# Patient Record
Sex: Male | Born: 1975 | Race: White | Hispanic: No | Marital: Single | Smoking: Light tobacco smoker
Health system: Southern US, Community
[De-identification: ages and names within clinical notes are randomized; demographics above are authoritative.]

## PROBLEM LIST (undated history)

## (undated) ENCOUNTER — Emergency Department (HOSPITAL_COMMUNITY): Admission: EM | Payer: BLUE CROSS/BLUE SHIELD | Source: Home / Self Care

---

## 2010-03-09 ENCOUNTER — Emergency Department (HOSPITAL_COMMUNITY): Admission: EM | Admit: 2010-03-09 | Discharge: 2010-03-09 | Payer: Self-pay | Admitting: Emergency Medicine

## 2012-02-03 ENCOUNTER — Emergency Department (HOSPITAL_COMMUNITY)
Admission: EM | Admit: 2012-02-03 | Discharge: 2012-02-03 | Discharge status: Absent without leave | Payer: Self-pay | Source: Home / Self Care

## 2012-05-20 ENCOUNTER — Emergency Department (HOSPITAL_COMMUNITY)
Admission: EM | Admit: 2012-05-20 | Discharge: 2012-05-20 | Disposition: A | Discharge status: Home / Self Care | Payer: Self-pay

## 2013-01-18 ENCOUNTER — Emergency Department (HOSPITAL_COMMUNITY)
Admission: EM | Admit: 2013-01-18 | Discharge: 2013-01-18 | Discharge status: Against Medical Advice | Payer: Self-pay | Attending: Emergency Medicine | Admitting: Emergency Medicine

## 2013-01-18 DIAGNOSIS — R112 Nausea with vomiting, unspecified: Secondary | ICD-10-CM | POA: Insufficient documentation

## 2013-01-18 NOTE — ED Notes (Signed)
Pt BIB EMS. Pt c/o nausea and vomiting since 0900 this AM. Pt ambulatory from ambulance to triage with steady gait. Pt with no acute distress.

## 2014-08-22 ENCOUNTER — Emergency Department (HOSPITAL_COMMUNITY)
Admission: EM | Admit: 2014-08-22 | Discharge: 2014-08-22 | Disposition: A | Discharge status: Home / Self Care | Payer: Self-pay | Source: Home / Self Care | Attending: Emergency Medicine | Admitting: Emergency Medicine

## 2014-08-22 NOTE — ED Notes (Signed)
Patient not in waiting room  

## 2014-08-22 NOTE — ED Notes (Signed)
Patient did not answer.  

## 2014-08-22 NOTE — ED Notes (Signed)
Called in both waiting room without answer

## 2014-11-02 ENCOUNTER — Telehealth: Payer: Self-pay | Admitting: Family Medicine

## 2014-11-02 NOTE — Telephone Encounter (Signed)
Patient has bcbs. He is having trouble with urinary issues. Appointment given for tomorrow with Elmarie Shileyiffany

## 2014-11-03 ENCOUNTER — Ambulatory Visit (INDEPENDENT_AMBULATORY_CARE_PROVIDER_SITE_OTHER): Payer: BLUE CROSS/BLUE SHIELD | Admitting: Physician Assistant

## 2014-11-03 ENCOUNTER — Encounter: Payer: Self-pay | Admitting: Physician Assistant

## 2014-11-03 ENCOUNTER — Encounter (INDEPENDENT_AMBULATORY_CARE_PROVIDER_SITE_OTHER): Payer: Self-pay

## 2014-11-03 VITALS — BP 141/89 | HR 59 | Temp 98.1°F | Ht 70.0 in | Wt 205.0 lb

## 2014-11-03 DIAGNOSIS — R300 Dysuria: Secondary | ICD-10-CM

## 2014-11-03 DIAGNOSIS — N410 Acute prostatitis: Secondary | ICD-10-CM | POA: Diagnosis not present

## 2014-11-03 LAB — POCT UA - MICROSCOPIC ONLY
Bacteria, U Microscopic: NEGATIVE
Bacteria, U Microscopic: NEGATIVE
CASTS, UR, LPF, POC: NEGATIVE
Casts, Ur, LPF, POC: NEGATIVE
Crystals, Ur, HPF, POC: NEGATIVE
Crystals, Ur, HPF, POC: NEGATIVE
Mucus, UA: NEGATIVE
RBC, urine, microscopic: NEGATIVE
RBC, urine, microscopic: NEGATIVE
WBC, UR, HPF, POC: NEGATIVE
YEAST UA: NEGATIVE
YEAST UA: NEGATIVE

## 2014-11-03 LAB — POCT URINALYSIS DIPSTICK
BILIRUBIN UA: NEGATIVE
GLUCOSE UA: NEGATIVE
Ketones, UA: NEGATIVE
LEUKOCYTES UA: NEGATIVE
NITRITE UA: NEGATIVE
PH UA: 6.5
Protein, UA: NEGATIVE
RBC UA: NEGATIVE
Spec Grav, UA: <=1.005
UROBILINOGEN UA: NEGATIVE

## 2014-11-03 MED ORDER — SULFAMETHOXAZOLE-TRIMETHOPRIM 800-160 MG PO TABS: 1.0000 | ORAL_TABLET | Freq: Two times a day (BID) | ORAL | Status: DC

## 2014-11-03 NOTE — Progress Notes (Signed)
   Subjective:    Patient ID: Randy Cabrera, male    DOB: [birth year], 39 y.o.   MRN: [id number]  [facility] with lower abdominal pain x 3 weeks, worse when he needs to urinate.Better after urinating.  Urinates smaller amounts, more frequently. Intermittently has lower back pain also. Has not has similar symptoms before. Denies new sexual partners. Monogomous. Describes pain as "similar to gas pain or when he needs to have a bowel movement"    Review of Systems  Constitutional: Negative.   HENT: Negative.   Respiratory: Negative.   Cardiovascular: Negative.   Gastrointestinal: Positive for abdominal pain (lower abdomen, suprapubic). Negative for nausea, vomiting, diarrhea, constipation, blood in stool, anal bleeding and rectal pain.  Genitourinary: Positive for urgency, frequency and decreased urine volume. Negative for dysuria, hematuria, flank pain, discharge, penile swelling, scrotal swelling, difficulty urinating, genital sores, penile pain and testicular pain.  Musculoskeletal: Positive for back pain (intermittent, center lower back). Negative for myalgias, joint swelling, gait problem, neck pain and neck stiffness.  Skin: Negative.        Objective:   Physical Exam  Constitutional: He appears well-developed and well-nourished. No distress.  HENT:  Head: Normocephalic.  Cardiovascular: Normal rate and regular rhythm.   Pulmonary/Chest: Effort normal.  Abdominal: Soft. Bowel sounds are normal. He exhibits no distension and no mass. There is tenderness (mild suprapubic TTDP). There is no rebound and no guarding.  Genitourinary:  UA negative for infection on initial sample. Prostate felt trace of enlargement on R compared to left. Patient stated feeling of "burning, pressure and urge to urinate" during exam. Repeat UA showed increased WBC's when compared to first UA  Neurological: He is alert.  Skin: He is not diaphoretic.  Nursing note and vitals reviewed.           Assessment & Plan:  1. Acute Prostatitis: Rx Bactrim DS BID x 10 days. F/U in 2 weeks for recheck.

## 2014-11-20 ENCOUNTER — Encounter: Payer: Self-pay | Admitting: Family

## 2014-11-20 ENCOUNTER — Ambulatory Visit (INDEPENDENT_AMBULATORY_CARE_PROVIDER_SITE_OTHER): Payer: BLUE CROSS/BLUE SHIELD

## 2014-11-20 ENCOUNTER — Ambulatory Visit (INDEPENDENT_AMBULATORY_CARE_PROVIDER_SITE_OTHER): Payer: BLUE CROSS/BLUE SHIELD | Admitting: Family

## 2014-11-20 VITALS — BP 135/81 | HR 71 | Temp 97.9°F | Ht 70.0 in | Wt 201.2 lb

## 2014-11-20 DIAGNOSIS — N410 Acute prostatitis: Secondary | ICD-10-CM

## 2014-11-20 DIAGNOSIS — R1030 Lower abdominal pain, unspecified: Secondary | ICD-10-CM

## 2014-11-20 LAB — POCT URINALYSIS DIPSTICK
Bilirubin, UA: NEGATIVE
Glucose, UA: NEGATIVE
KETONES UA: NEGATIVE
Leukocytes, UA: NEGATIVE
Nitrite, UA: NEGATIVE
PROTEIN UA: NEGATIVE
SPEC GRAV UA: 1.02
Urobilinogen, UA: NEGATIVE
pH, UA: 6.5

## 2014-11-20 LAB — POCT CBC
GRANULOCYTE PERCENT: 81.7 % — AB (ref 37–80)
HEMATOCRIT: 47.8 % (ref 43.5–53.7)
Hemoglobin: 15 g/dL (ref 14.1–18.1)
Lymph, poc: 0.6 (ref 0.6–3.4)
MCH: 27.8 pg (ref 27–31.2)
MCHC: 31.5 g/dL — AB (ref 31.8–35.4)
MCV: 88.4 fL (ref 80–97)
MPV: 6.4 fL (ref 0–99.8)
PLATELET COUNT, POC: 362 10*3/uL (ref 142–424)
POC GRANULOCYTE: 3.8 (ref 2–6.9)
POC LYMPH %: 13.7 % (ref 10–50)
RBC: 5.4 M/uL (ref 4.69–6.13)
RDW, POC: 12.6 %
WBC: 4.7 10*3/uL (ref 4.6–10.2)

## 2014-11-20 LAB — POCT UA - MICROSCOPIC ONLY
Bacteria, U Microscopic: NEGATIVE
CASTS, UR, LPF, POC: NEGATIVE
CRYSTALS, UR, HPF, POC: NEGATIVE
Yeast, UA: NEGATIVE

## 2014-11-20 MED ORDER — CIPROFLOXACIN HCL 500 MG PO TABS: 500.0000 mg | ORAL_TABLET | Freq: Two times a day (BID) | ORAL | Status: DC

## 2014-11-20 NOTE — Patient Instructions (Signed)

## 2014-11-20 NOTE — Progress Notes (Signed)
   Subjective:    Patient ID: Randy Cabrera, male    DOB: 1976-05-27, 39 y.o.   MRN: [id number]  Abdominal Pain This is a recurrent problem. The current episode started more than 1 month ago. The onset quality is sudden. The problem occurs intermittently. The problem has been waxing and waning. The pain is located in the LLQ. The pain is at a severity of 5/10. The pain is mild. The quality of the pain is aching. The abdominal pain radiates to the back. Associated symptoms include a fever and frequency. Pertinent negatives include no constipation, diarrhea, dysuria, nausea or weight loss. Nothing aggravates the pain. He has tried antibiotics for the symptoms. The treatment provided mild relief.   *Pt seen in the office on 11/03/14 and was treated for acute prostatitis. He was given Bactrim BID for 10 days. Pt states he feels better, but is still having intermittent lower abd pain.    Review of Systems  Constitutional: Positive for fever. Negative for weight loss.  HENT: Negative.   Respiratory: Negative.   Cardiovascular: Negative.   Gastrointestinal: Positive for abdominal pain. Negative for nausea, diarrhea and constipation.  Endocrine: Negative.   Genitourinary: Positive for frequency. Negative for dysuria.  Musculoskeletal: Negative.   Neurological: Negative.   Hematological: Negative.   Psychiatric/Behavioral: Negative.   All other systems reviewed and are negative.      Objective:   Physical Exam  Constitutional: He is oriented to person, place, and time. He appears well-developed and well-nourished. No distress.  HENT:  Head: Normocephalic.  Right Ear: External ear normal.  Left Ear: External ear normal.  Nose: Nose normal.  Mouth/Throat: Oropharynx is clear and moist.  Eyes: Pupils are equal, round, and reactive to light. Right eye exhibits no discharge. Left eye exhibits no discharge.  Neck: Normal range of motion. Neck supple. No thyromegaly present.  Cardiovascular:  Normal rate, regular rhythm, normal heart sounds and intact distal pulses.   No murmur heard. Pulmonary/Chest: Effort normal and breath sounds normal. No respiratory distress. He has no wheezes.  Abdominal: Soft. Bowel sounds are normal. He exhibits no distension. There is no tenderness.  Musculoskeletal: Normal range of motion. He exhibits no edema or tenderness.  Neurological: He is alert and oriented to person, place, and time. He has normal reflexes. No cranial nerve deficit.  Skin: Skin is warm and dry. No rash noted. No erythema.  Psychiatric: He has a normal mood and affect. His behavior is normal. Judgment and thought content normal.  Vitals reviewed.   BP 135/81 mmHg  Pulse 71  Temp(Src) 97.9 F (36.6 C) (Oral)  Ht 5\' 10"  (1.778 m)  Wt 201 lb 3.2 oz (91.264 kg)  BMI 28.87 kg/m2  KUB- Gas in upper abd area, WNL -Preliminary reading by Jannifer Rodneyhristy Ladell Lea, FNP [facility]      Assessment & Plan:  1. Lower abdominal pain - POCT CBC - DG Abd 1 View; Future - POCT UA - Microscopic Only - POCT urinalysis dipstick  2. Acute prostatitis -Tylenol prn for pain -RTO 2 weeks -If still having pain, may need to refer to GI? - ciprofloxacin (CIPRO) 500 MG tablet; Take 1 tablet (500 mg total) by mouth 2 (two) times daily.  Dispense: 30 tablet; Refill: 0  Jannifer Rodneyhristy Ayauna Mcnay, FNP

## 2015-01-05 ENCOUNTER — Ambulatory Visit (INDEPENDENT_AMBULATORY_CARE_PROVIDER_SITE_OTHER): Payer: BLUE CROSS/BLUE SHIELD | Admitting: Family

## 2015-01-05 ENCOUNTER — Telehealth: Payer: Self-pay | Admitting: Family

## 2015-01-05 ENCOUNTER — Encounter: Payer: Self-pay | Admitting: Family

## 2015-01-05 ENCOUNTER — Ambulatory Visit (INDEPENDENT_AMBULATORY_CARE_PROVIDER_SITE_OTHER): Payer: BLUE CROSS/BLUE SHIELD

## 2015-01-05 VITALS — BP 119/68 | HR 65 | Temp 97.0°F | Ht 70.0 in | Wt 210.4 lb

## 2015-01-05 DIAGNOSIS — R1032 Left lower quadrant pain: Secondary | ICD-10-CM

## 2015-01-05 LAB — POCT UA - MICROSCOPIC ONLY
Bacteria, U Microscopic: NEGATIVE
CASTS, UR, LPF, POC: NEGATIVE
Crystals, Ur, HPF, POC: NEGATIVE
WBC, Ur, HPF, POC: NEGATIVE
Yeast, UA: NEGATIVE

## 2015-01-05 LAB — POCT CBC
GRANULOCYTE PERCENT: 63.7 % (ref 37–80)
HEMATOCRIT: 44 % (ref 43.5–53.7)
Hemoglobin: 14.2 g/dL (ref 14.1–18.1)
Lymph, poc: 2 (ref 0.6–3.4)
MCH: 28.6 pg (ref 27–31.2)
MCHC: 32.4 g/dL (ref 31.8–35.4)
MCV: 88.2 fL (ref 80–97)
MPV: 7 fL (ref 0–99.8)
PLATELET COUNT, POC: 386 10*3/uL (ref 142–424)
POC Granulocyte: 4.3 (ref 2–6.9)
POC LYMPH PERCENT: 30 %L (ref 10–50)
RBC: 4.99 M/uL (ref 4.69–6.13)
RDW, POC: 13.7 %
WBC: 6.8 10*3/uL (ref 4.6–10.2)

## 2015-01-05 LAB — POCT URINALYSIS DIPSTICK
BILIRUBIN UA: NEGATIVE
GLUCOSE UA: NEGATIVE
KETONES UA: NEGATIVE
Leukocytes, UA: NEGATIVE
Nitrite, UA: NEGATIVE
PH UA: 6
PROTEIN UA: NEGATIVE
Spec Grav, UA: 1.02
Urobilinogen, UA: NEGATIVE

## 2015-01-05 NOTE — Patient Instructions (Addendum)

## 2015-01-05 NOTE — Progress Notes (Signed)
   Subjective:    Patient ID: Randy Cabrera, male    DOB: Aug 19, 1975, 39 y.o.   MRN: [id number]  Abdominal Pain This is a recurrent problem. The current episode started more than 1 month ago. The onset quality is gradual. The problem occurs constantly. The problem has been waxing and waning. The pain is located in the LLQ. The pain is at a severity of 1/10. The pain is mild. The quality of the pain is burning. The abdominal pain does not radiate. Pertinent negatives include no belching, constipation, diarrhea, dysuria, flatus, frequency, headaches, myalgias, nausea or vomiting. The pain is aggravated by movement. The pain is relieved by being still. He has tried acetaminophen for the symptoms. The treatment provided mild relief.      Review of Systems  Constitutional: Negative.   HENT: Negative.   Respiratory: Negative.   Cardiovascular: Negative.   Gastrointestinal: Positive for abdominal pain. Negative for nausea, vomiting, diarrhea, constipation and flatus.  Endocrine: Negative.   Genitourinary: Negative.  Negative for dysuria and frequency.  Musculoskeletal: Negative.  Negative for myalgias.  Neurological: Negative.  Negative for headaches.  Hematological: Negative.   Psychiatric/Behavioral: Negative.   All other systems reviewed and are negative.      Objective:   Physical Exam  Constitutional: He is oriented to person, place, and time. He appears well-developed and well-nourished. No distress.  HENT:  Head: Normocephalic.  Right Ear: External ear normal.  Left Ear: External ear normal.  Mouth/Throat: Oropharynx is clear and moist.  Eyes: Pupils are equal, round, and reactive to light. Right eye exhibits no discharge. Left eye exhibits no discharge.  Neck: Normal range of motion. Neck supple. No thyromegaly present.  Cardiovascular: Normal rate, regular rhythm, normal heart sounds and intact distal pulses.   No murmur heard. Pulmonary/Chest: Effort normal and breath sounds  normal. No respiratory distress. He has no wheezes.  Abdominal: Soft. Bowel sounds are normal. He exhibits no distension. There is no tenderness.  Musculoskeletal: Normal range of motion. He exhibits no edema or tenderness.  Neurological: He is alert and oriented to person, place, and time. He has normal reflexes. No cranial nerve deficit.  Skin: Skin is warm and dry. No rash noted. No erythema.  Psychiatric: He has a normal mood and affect. His behavior is normal. Judgment and thought content normal.  Vitals reviewed.  G-I series- X-Ray WNL Preliminary reading by Jannifer Rodneyhristy Ernesta Trabert, FNP [facility]    BP 119/68 mmHg  Pulse 65  Temp(Src) 97 F (36.1 C) (Oral)  Ht 5\' 10"  (1.778 m)  Wt 210 lb 6.4 oz (95.437 kg)  BMI 30.19 kg/m2     Assessment & Plan:  1. LLQ pain -Take daily Miralax -RTO prn - DG Abd Acute W/Chest; Future - Ambulatory referral to Gastroenterology - POCT urinalysis dipstick - POCT UA - Microscopic Only - POCT CBC  Jannifer Rodneyhristy Ercie Eliasen, FNP

## 2015-01-05 NOTE — Telephone Encounter (Signed)
Patient aware of results.

## 2015-01-06 ENCOUNTER — Encounter: Payer: Self-pay | Admitting: Physician Assistant

## 2015-01-22 ENCOUNTER — Encounter: Payer: Self-pay | Admitting: Physician Assistant

## 2015-01-22 ENCOUNTER — Ambulatory Visit (INDEPENDENT_AMBULATORY_CARE_PROVIDER_SITE_OTHER): Payer: BLUE CROSS/BLUE SHIELD | Admitting: Physician Assistant

## 2015-01-22 VITALS — BP 90/60 | HR 64 | Ht 70.0 in | Wt 211.4 lb

## 2015-01-22 DIAGNOSIS — R194 Change in bowel habit: Secondary | ICD-10-CM | POA: Diagnosis not present

## 2015-01-22 DIAGNOSIS — R195 Other fecal abnormalities: Secondary | ICD-10-CM

## 2015-01-22 DIAGNOSIS — R1032 Left lower quadrant pain: Secondary | ICD-10-CM

## 2015-01-22 MED ORDER — NA SULFATE-K SULFATE-MG SULF 17.5-3.13-1.6 GM/177ML PO SOLN: 1.0000 | Freq: Once | ORAL | Status: AC

## 2015-01-22 NOTE — Progress Notes (Signed)
Patient ID: Randy Cabrera, male   DOB: 12/03/1975, 39 y.o.   MRN: [id number]    HPI:  Randy Cabrera is a 39 y.o.   male referred by Sharion Balloon, FNP  For evaluation of left lower quadrant abdominal pain.    Randy Cabrera states he has had lower abdominal pain on the left side for about 3 months duration. His pain is intermittent and comes and goes. It is sometimes exacerbated with ingestion of food and is often alleviated with passage of gas or defecation. He occasionally feels like he is developing a bulge just above the left groin. At the onset of his pain he was having urinary pressure and urgency. He was evaluated at his PCPs office and treated with a course of Bactrim for 7 days for presumed prostatitis. He had little relief of his symptoms had returned to his PCPs office where he was given a course of Cipro. With the Cipro his urinary symptoms have resolved but he continues to have intermittent left lower quadrant pain. He also reports that for the past 3 months his stool has changed. He is now having pencil thin stools that are broken into pieces. He has one bowel movement in the morning and then 2 or 3 mushy bowel movements in the afternoon. He has no melena but has occasionally had bloody spotting on the toilet tissue. He has no mucus with the stools and denies tenesmus. He has no anorexia or unexplained weight loss, and there is no known family history of colon cancer, colon polyps, or inflammatory bowel disease. He has no rectal pain, itching, or burning. He has no scrotal pain at this time. He has had abdominal films that were negative. CBC in May 2016 was normal. He notes that his discomfort is occasionally exacerbated with running or playing basketball. 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   History reviewed. No pertinent past medical history.  History reviewed. No pertinent past surgical history. Family History  Problem Relation Age of  Onset  . Lymphoma Maternal Grandmother    History  Substance Use Topics  . Smoking status: Light Tobacco Smoker    Types: Cigars    Last Attempt to Quit: 08/14/2006  . Smokeless tobacco: Never Used  . Alcohol Use: 0.0 oz/week    0 Standard drinks or equivalent per week   Current Outpatient Prescriptions  Medication Sig Dispense Refill  . Na Sulfate-K Sulfate-Mg Sulf SOLN Take 1 kit by mouth once. 354 mL 0   No current facility-administered medications for this visit.   No Known Allergies   Review of Systems: Gen: Denies any fever, chills, sweats, anorexia, fatigue, weakness, malaise, weight loss, and sleep disorder CV: Denies chest pain, angina, palpitations, syncope, orthopnea, PND, peripheral edema, and claudication. Resp: Denies dyspnea at rest, dyspnea with exercise, cough, sputum, wheezing, coughing up blood, and pleurisy. GI: Denies vomiting blood, jaundice, and fecal incontinence.   Denies dysphagia or odynophagia. GU : Denies urinary burning, blood in urine, urinary frequency, urinary hesitancy, nocturnal urination, and urinary incontinence. MS: Denies joint pain, limitation of movement, and swelling, stiffness, low back pain, extremity pain. Denies muscle weakness, cramps, atrophy.  Derm: Denies rash, itching, dry skin, hives, moles, warts, or unhealing ulcers.  Psych: Denies depression, anxiety, memory loss, suicidal ideation, hallucinations, paranoia, and confusion. Heme: Denies bruising, bleeding, and enlarged lymph nodes. Neuro:  Denies any headaches, dizziness, paresthesias. Endo:  Denies any problems with DM,  thyroid, adrenal function  Studies: Dg Abd Acute W/chest  01/05/2015   CLINICAL DATA:  Left lower quadrant abdominal pain for several months.  EXAM: DG ABDOMEN ACUTE W/ 1V CHEST  COMPARISON:  11/20/2014  FINDINGS: The upright chest x-ray is normal.  No acute pulmonary findings.  Two views of the abdomen demonstrate a normal bowel gas pattern. No findings for  obstruction or perforation. The soft tissue shadows are maintained. No worrisome calcifications. The bony structures are unremarkable.  IMPRESSION: Unremarkable acute abdominal series.   Electronically Signed   By: Marijo Sanes M.D.   On: 01/05/2015 15:03    LAB RESULTS:  CBC on 01/05/2015 white count 6.8, hemoglobin 14.2, hematocrit 44, platelets 386,000. MCV 88.2.   Physical Exam: BP 90/60 mmHg  Pulse 64  Ht $R'5\' 10"'cj$  (1.778 m)  Wt 211 lb 6.4 oz (95.89 kg)  BMI 30.33 kg/m2 Constitutional: Pleasant,well-developed male in no acute distress. HEENT: Normocephalic and atraumatic. Conjunctivae are normal. No scleral icterus. Neck supple.  No thyromegaly Cardiovascular: Normal rate, regular rhythm.  Pulmonary/chest: Effort normal and breath sounds normal. No wheezing, rales or rhonchi. Abdominal: Soft, nondistended,  Mild left lower quadrant and suprapubic tenderness. Bowel sounds active throughout. There are no masses palpable. No hepatomegaly. Questionable bold she can left inguinal area but no tenderness  over epididymis her scrotum Rectal:  No hemorrhoids noted. Brown stool Hemoccult-positive Extremities: no edema Lymphadenopathy: No cervical adenopathy noted. Neurological: Alert and oriented to person place and time. Skin: Skin is warm and dry. No rashes noted. Psychiatric: Normal mood and affect. Behavior is normal.  ASSESSMENT AND PLAN:  39 year old male presenting with a 3 month history of left lower quadrant abdominal pain associated with a change in bowel habits. CBC and basic metabolic panel will be obtained. He will be scheduled for a CT of the abdomen and pelvis to evaluate for possible diverticular disease as well as an abdominal wall hernia. He has also been found to have heme positive stools. He will be scheduled for colonoscopy to evaluate for polyps, neoplasia, IBD, internal hemorrhoids etc..The risks, benefits, and alternatives to colonoscopy with possible biopsy and possible  polypectomy were discussed with the patient and they consent to proceed.   The procedure will be scheduled with Dr. Deatra Ina. Further recommendations will be made pending the findings of the above.    Mackena Plummer, Deloris Ping 01/22/2015, 11:49 AM  CC: Sharion Balloon, FNP

## 2015-01-22 NOTE — Patient Instructions (Signed)
Please go to the basement level to have your labs drawn.  We sent a prescription to [facility], [place].   You have been scheduled for a colonoscopy. Please follow written instructions given to you at your visit today.  Please pick up your prep supplies at the pharmacy within the next 1-3 days. If you use inhalers (even only as needed), please bring them with you on the day of your procedure. Your physician has requested that you go to www.startemmi.com and enter the access code given to you at your visit today. This web site gives a general overview about your procedure. However, you should still follow specific instructions given to you by our office regarding your preparation for the procedure.    You have been scheduled for a CT scan of the abdomen and pelvis at [facility] ([address]---this is in the same building as Press photographer).   You are scheduled on Monday 6-13 at 2:00 Pm You should arrive at 1:45 PM  prior to your appointment time for registration. Please follow the written instructions below on the day of your exam:  WARNING: IF YOU ARE ALLERGIC TO IODINE/X-RAY DYE, PLEASE NOTIFY RADIOLOGY IMMEDIATELY AT [phone]! YOU WILL BE GIVEN A 13 HOUR PREMEDICATION PREP.  1) Do not eat or drink anything after 10:00 am (4 hours prior to your test) 2) You have been given 2 bottles of oral contrast to drink. The solution may taste  better if refrigerated, but do NOT add ice or any other liquid to this solution. Shake  well before drinking.    Drink 1 bottle of contrast @ 1:00 Pm  (2 hours prior to your exam)  Drink 1 bottle of contrast @ 2:00 PM  (1 hour prior to your exam)  You may take any medications as prescribed with a small amount of water except for the following: Metformin, Glucophage, Glucovance, Avandamet, Riomet, Fortamet, Actoplus Met, Janumet, Glumetza or Metaglip. The above medications must be held the day of the exam AND 48 hours after the  exam.  The purpose of you drinking the oral contrast is to aid in the visualization of your intestinal tract. The contrast solution may cause some diarrhea. Before your exam is started, you will be given a small amount of fluid to drink. Depending on your individual set of symptoms, you may also receive an intravenous injection of x-ray contrast/dye. Plan on being at [facility] for 30 minutes or long, depending on the type of exam you are having performed.  If you have any questions regarding your exam or if you need to reschedule, you may call the CT department at [phone] between the hours of 8:00 am and 5:00 pm, Monday-Friday.  ________________________________________________________________________

## 2015-01-25 ENCOUNTER — Ambulatory Visit (INDEPENDENT_AMBULATORY_CARE_PROVIDER_SITE_OTHER)
Admission: RE | Admit: 2015-01-25 | Discharge: 2015-01-25 | Disposition: A | Discharge status: Home / Self Care | Payer: BLUE CROSS/BLUE SHIELD | Source: Ambulatory Visit | Attending: Physician Assistant | Admitting: Physician Assistant

## 2015-01-25 DIAGNOSIS — R1032 Left lower quadrant pain: Secondary | ICD-10-CM

## 2015-01-25 MED ORDER — IOHEXOL 300 MG/ML  SOLN
100.0000 mL | Freq: Once | INTRAMUSCULAR | Status: AC | PRN
  Administered 2015-01-25: 100 mL via INTRAVENOUS

## 2015-01-25 NOTE — Progress Notes (Signed)
Reviewed and agree with management. Robert D. Kaplan, M.D., FACG  

## 2015-01-26 ENCOUNTER — Other Ambulatory Visit: Payer: Self-pay

## 2015-01-26 MED ORDER — METRONIDAZOLE 250 MG PO TABS: 250.0000 mg | ORAL_TABLET | Freq: Three times a day (TID) | ORAL | Status: AC

## 2015-01-26 MED ORDER — CIPROFLOXACIN HCL 500 MG PO TABS: 500.0000 mg | ORAL_TABLET | Freq: Two times a day (BID) | ORAL | Status: AC

## 2015-01-26 MED ORDER — DICYCLOMINE HCL 10 MG PO CAPS: ORAL_CAPSULE | ORAL | Status: AC

## 2015-03-15 ENCOUNTER — Encounter: Payer: BLUE CROSS/BLUE SHIELD | Admitting: Gastroenterology

## 2016-09-21 IMAGING — CR DG ABDOMEN ACUTE W/ 1V CHEST
3 series · 3 of 3 positions shown · non-contrast
Comparison: 11/20/2014

CLINICAL DATA: Left lower quadrant abdominal pain for several
months.

EXAM:
DG ABDOMEN ACUTE W/ 1V CHEST

[view not recorded (1 of 3)]
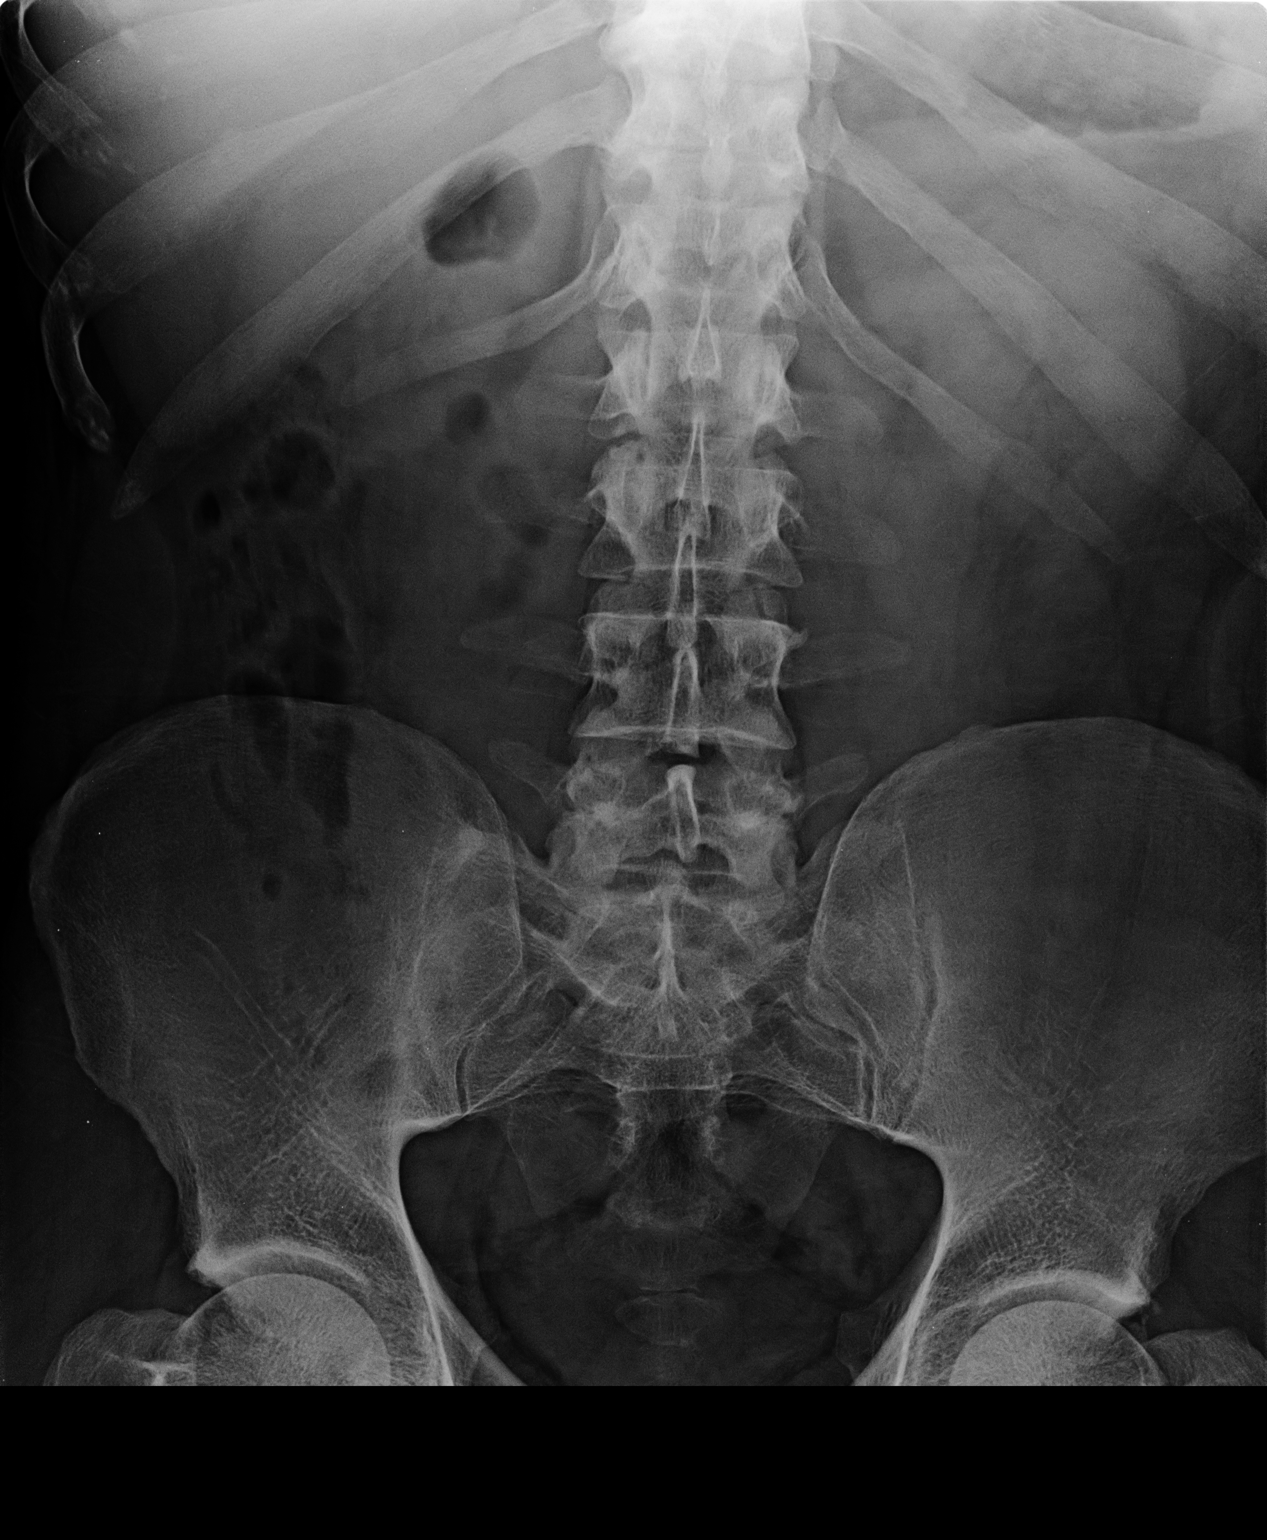

[view not recorded (2 of 3)]
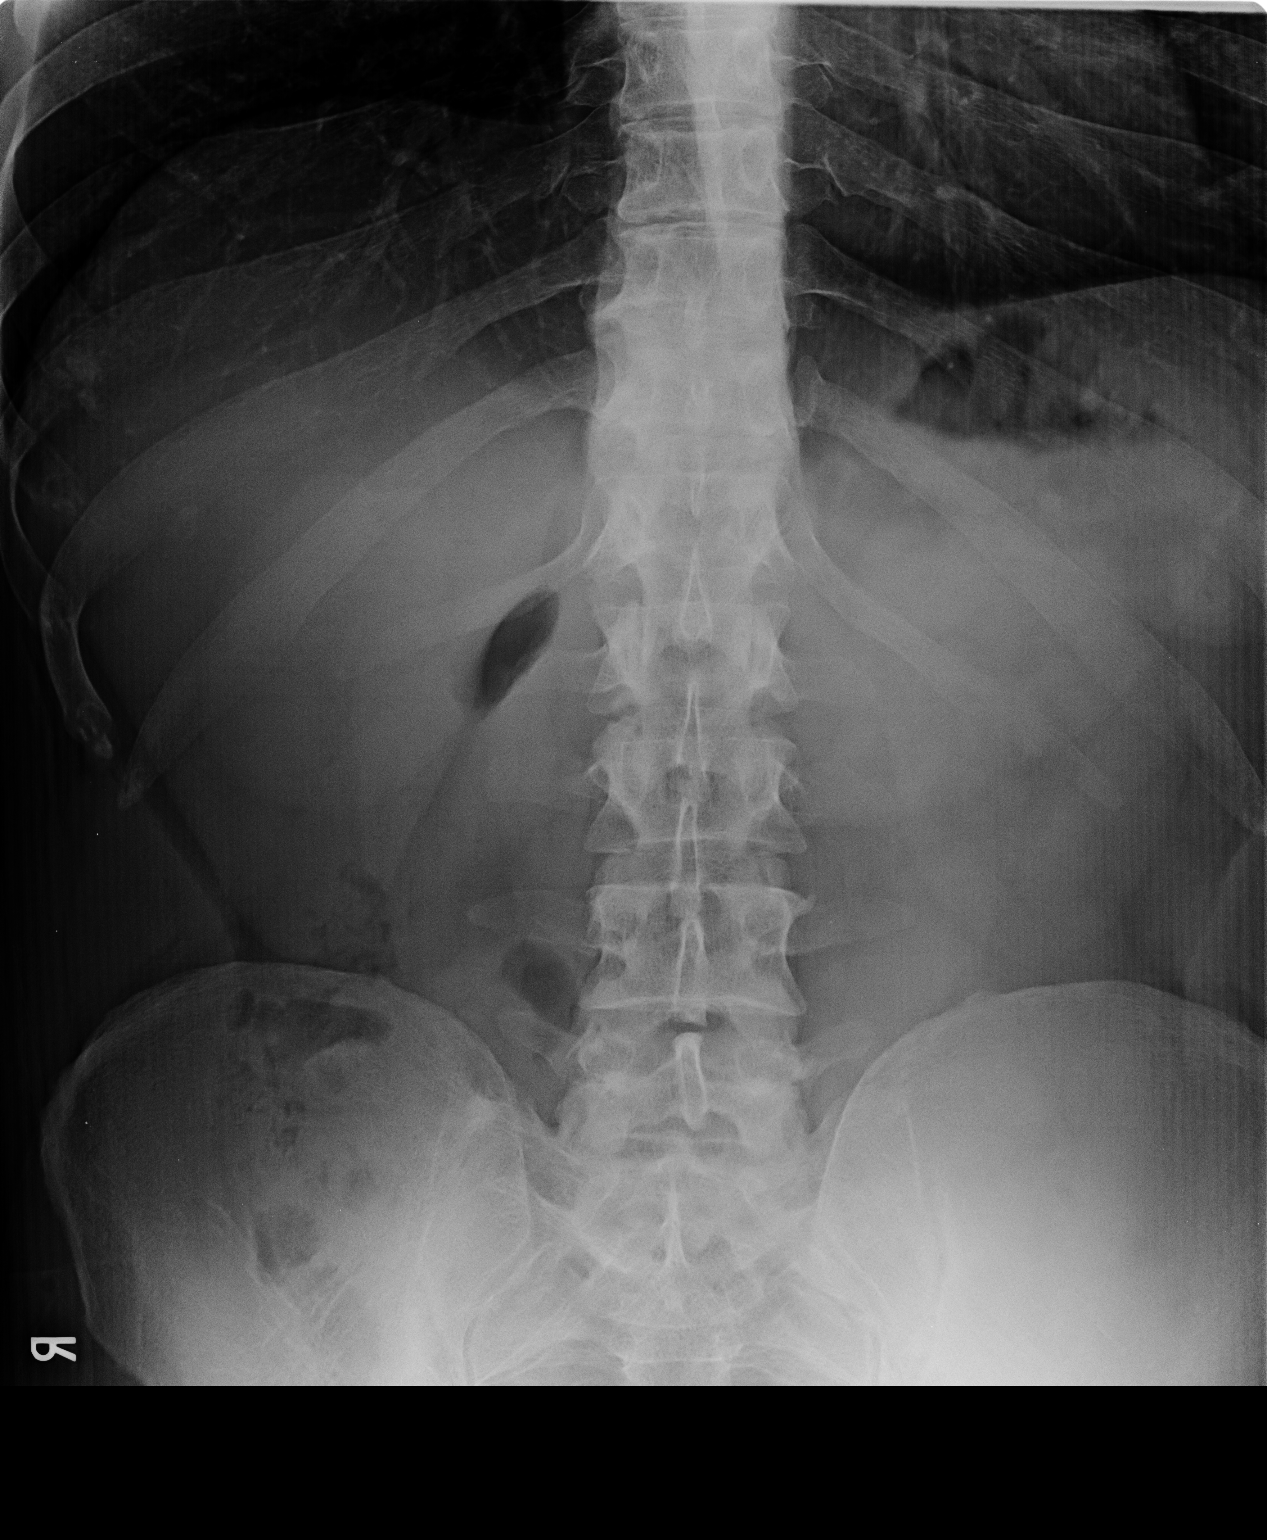

[view not recorded (3 of 3)]
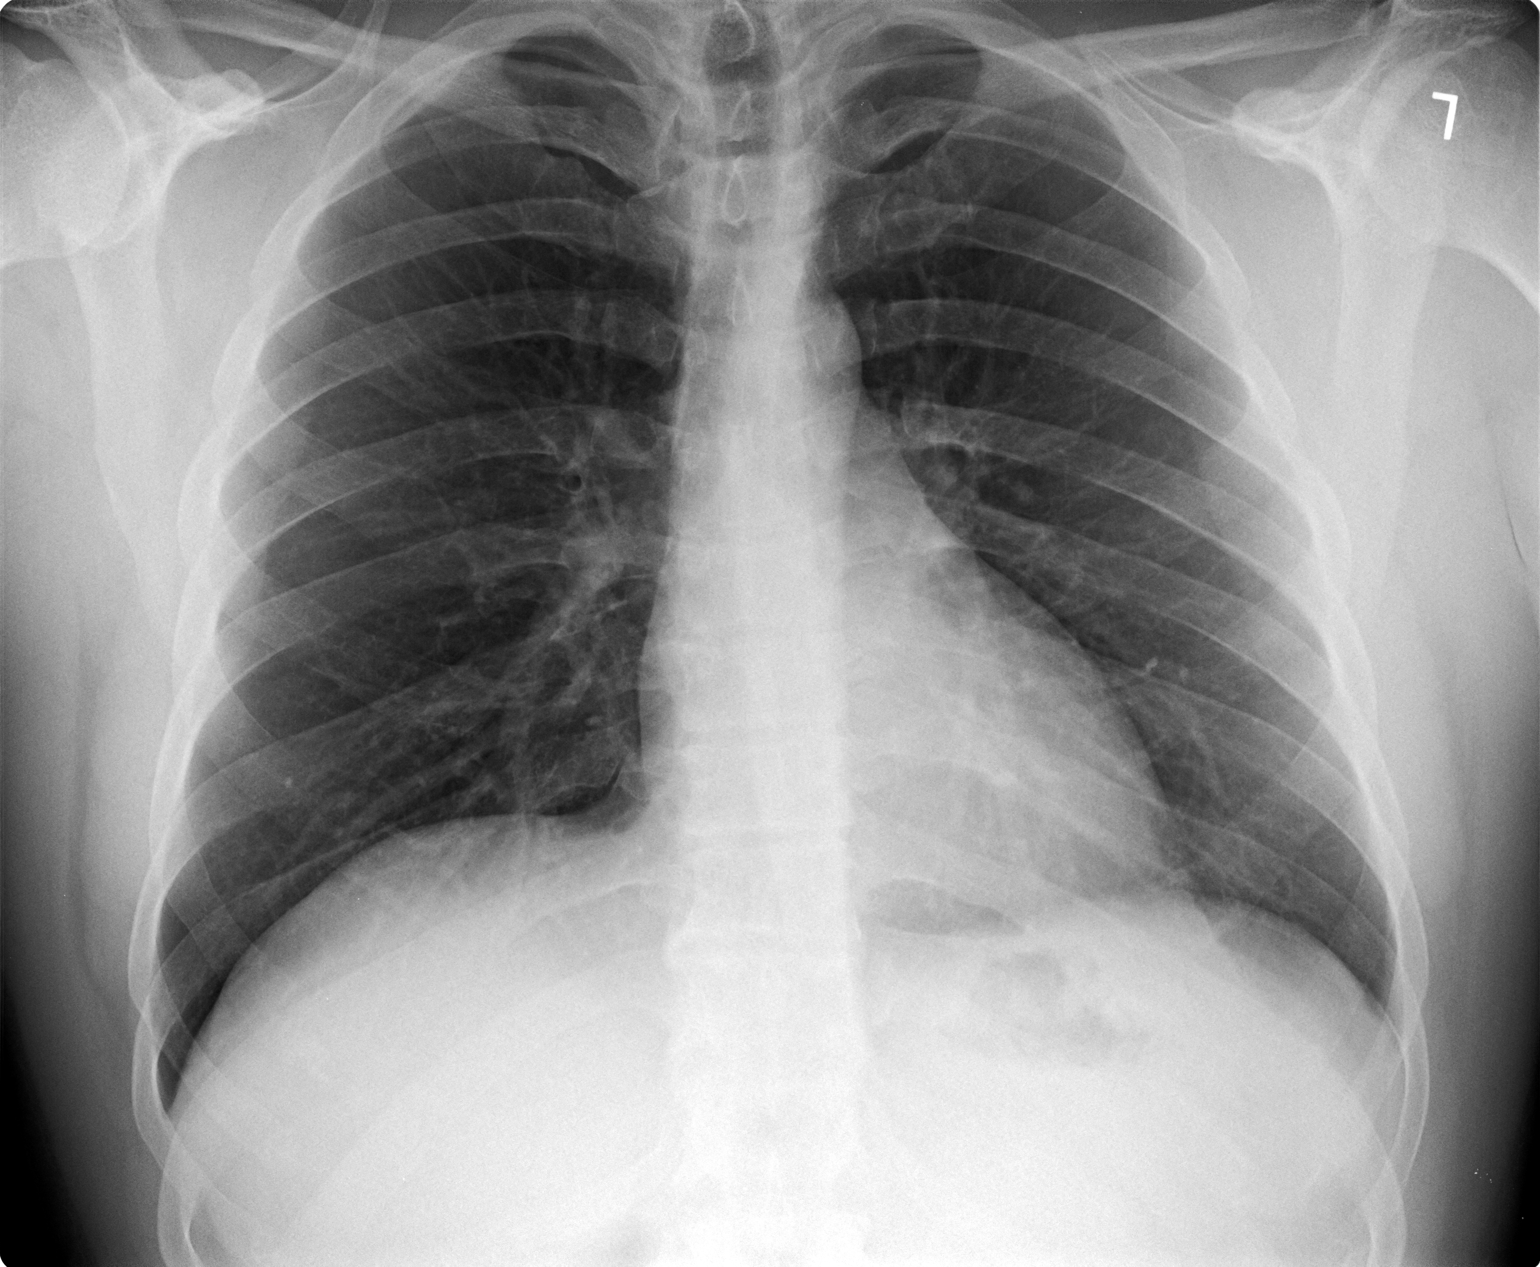

[3 of 3 positions shown; findings below may reference images not displayed]

FINDINGS: The upright chest x-ray is normal.  No acute pulmonary findings.

Two views of the abdomen demonstrate a normal bowel gas pattern. No
findings for obstruction or perforation. The soft tissue shadows are
maintained. No worrisome calcifications. The bony structures are
unremarkable.
IMPRESSION: Unremarkable acute abdominal series.

## 2020-08-17 ENCOUNTER — Other Ambulatory Visit: Payer: BLUE CROSS/BLUE SHIELD

## 2020-08-18 ENCOUNTER — Other Ambulatory Visit: Payer: BLUE CROSS/BLUE SHIELD
